# Patient Record
Sex: Male | Born: 1955 | Race: White | Hispanic: No | Marital: Married
Health system: Midwestern US, Academic
[De-identification: ages and names within clinical notes are randomized; demographics above are authoritative.]

---

## 2013-11-25 ENCOUNTER — Ambulatory Visit
Admission: RE | Admit: 2013-11-25 | Discharge: 2013-11-25 | Disposition: A | Discharge status: Home / Self Care | Payer: No Typology Code available for payment source | Source: Ambulatory Visit | Attending: Physician Assistant | Admitting: Physician Assistant

## 2013-11-25 ENCOUNTER — Ambulatory Visit: Payer: Self-pay | Admitting: Emergency Medicine

## 2013-11-25 ENCOUNTER — Telehealth: Payer: Self-pay | Admitting: *Deleted

## 2013-11-25 VITALS — BP 128/78 | HR 72 | Temp 98.2°F | Resp 18 | Ht 73.0 in | Wt 225.0 lb

## 2013-11-25 DIAGNOSIS — T24009A Burn of unspecified degree of unspecified site of unspecified lower limb, except ankle and foot, initial encounter: Secondary | ICD-10-CM

## 2013-11-25 DIAGNOSIS — M7989 Other specified soft tissue disorders: Secondary | ICD-10-CM

## 2013-11-25 DIAGNOSIS — M79609 Pain in unspecified limb: Secondary | ICD-10-CM

## 2013-11-25 DIAGNOSIS — Z23 Encounter for immunization: Secondary | ICD-10-CM

## 2013-11-25 DIAGNOSIS — M79669 Pain in unspecified lower leg: Secondary | ICD-10-CM

## 2013-11-25 MED ORDER — CEPHALEXIN 500 MG PO CAPS: 500.0000 mg | ORAL_CAPSULE | Freq: Three times a day (TID) | ORAL | Status: AC

## 2013-11-25 MED ORDER — SILVER SULFADIAZINE 1 % EX CREA: 1.0000 "application " | TOPICAL_CREAM | Freq: Every day | CUTANEOUS | Status: AC

## 2013-11-25 NOTE — Progress Notes (Signed)
[facility] called US report: No evidence of a DVT He does have a popliteal cyst.  Gave results to Heather Marte- Asked to call pt- Anti-inflammatory medications and elevation.    Unable to leave message- voicemail not set up. 

## 2013-11-25 NOTE — Progress Notes (Signed)
   Subjective:    Patient ID: Nicholas Hunt, male    DOB: 12/24/1955, 58 y.o.   MRN: [id number]  HPI 58 year old male presents for evaluation of a burn on his right calf. Injury occurred 1.5 weeks ago - states he fell asleep with the heating pad on his knee and the cloth slipped off causing a burn. Noticed it when he woke up.  Admits it did form a blister and he "popped it." He has been trying to keep it clean and has used OTC antibiotic cream but is here today because it has not completely resolved yet.  No drainage, warmth, or tenderness.    Patient is also concerned about right lower leg swelling that has been happening intermittently for "a while." He is not sure exactly how long it has been going on but notices it mainly after running on a treadmill. Does admit it is painful sometimes. Denies hx of blood clots or family hx of clotting disorders.  No chest pain, SOB, dizziness, palpitations, nausea, vomiting, fever, or chills.  Has not been on a plane since he returned from [place] 6 months ago. Has recently quit smoking about 2 months ago.     Last tetanus unknown.    Review of Systems  Constitutional: Negative for fever and chills.  Respiratory: Negative for cough, chest tightness and shortness of breath.   Cardiovascular: Positive for leg swelling (right leg). Negative for chest pain and palpitations.  Gastrointestinal: Negative for nausea and vomiting.  Skin: Positive for color change and wound.  Neurological: Negative for dizziness and headaches.       Objective:   Physical Exam  Constitutional: He is oriented to person, place, and time. He appears well-developed and well-nourished.  HENT:  Head: Normocephalic and atraumatic.  Right Ear: External ear normal.  Left Ear: External ear normal.  Eyes: Conjunctivae are normal.  Neck: Normal range of motion.  Cardiovascular: Normal rate.   Pulmonary/Chest: Effort normal.  Musculoskeletal:       Right lower leg: He exhibits swelling  (R>L by 0.5 inch in measurement). He exhibits no tenderness.       Left lower leg: Normal.  Neurological: He is alert and oriented to person, place, and time.  Skin:     Noted area has 2.5 cm scabbed burn with overlying eschar. There is about of surrounding erythema. No purulence, warmth, induration or fluctuance noted.    Psychiatric: He has a normal mood and affect. His behavior is normal. Judgment and thought content normal.          Assessment & Plan:  Pain and swelling of lower leg - Plan: cephALEXin (KEFLEX) 500 MG capsule, silver sulfADIAZINE (SILVADENE) 1 % cream, US Venous Img Lower Unilateral Right, CANCELED: US Venous Img Lower Unilateral Right  Burn of leg - Plan: Td vaccine greater than or equal to 7yo preservative free IM  Need for Tdap vaccination  [facility] at 4:00 p.m If negative, likely musculoskeletal cause of swelling and will treat as such with close f/u. Treat burn with topical silvadene daily and keflex 500 mg tid X 10 days Tetanus updated.  Will determine f/u based on results of US today.

## 2013-11-25 NOTE — Telephone Encounter (Signed)
[facility] called US report: No evidence of a DVT He does have a popliteal cyst.  Gave results to Rhoderick MoodyHeather Marte- Asked to call pt- Anti-inflammatory medications and elevation.    Unable to leave message- voicemail not set up.

## 2013-11-26 NOTE — Telephone Encounter (Signed)
Pt has been advised.  He will RTC if needed.

## 2015-03-21 IMAGING — US US EXTREM LOW VENOUS*R*
1 series · 13 of 24 positions shown · non-contrast
Comparison: None.

CLINICAL DATA: Right leg pain and swelling.



[Series 1: us extrem low venous*right* · 13 of 38 slices shown]
[im 1/38]
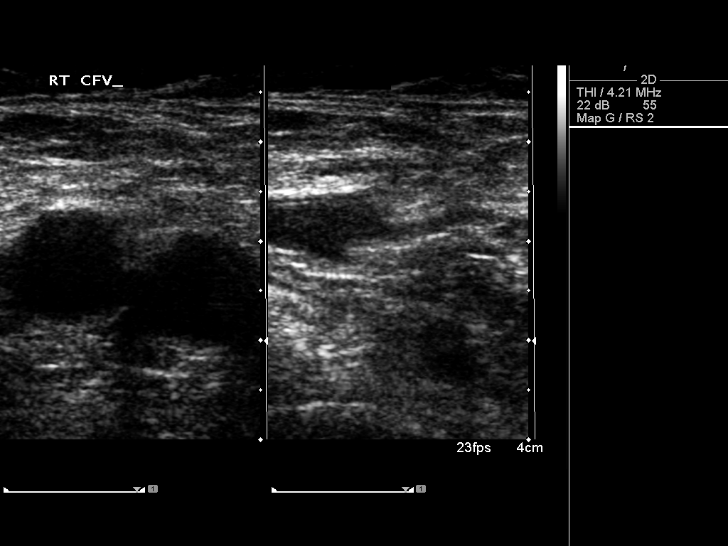
[im 4/38]
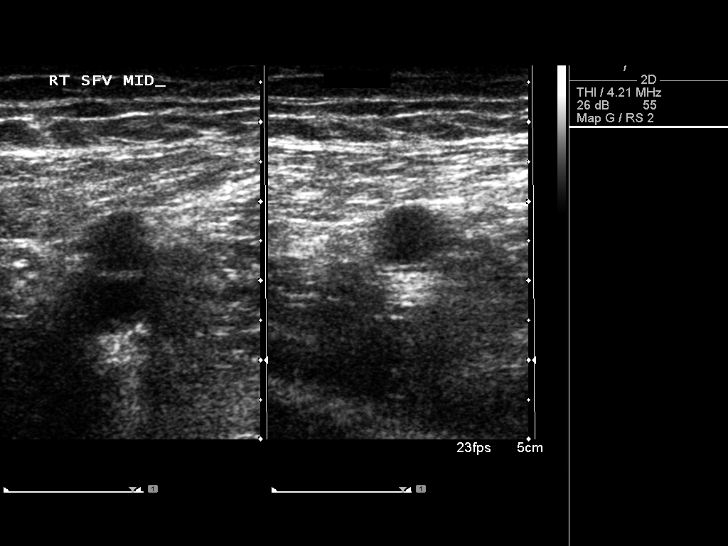
[im 7/38]
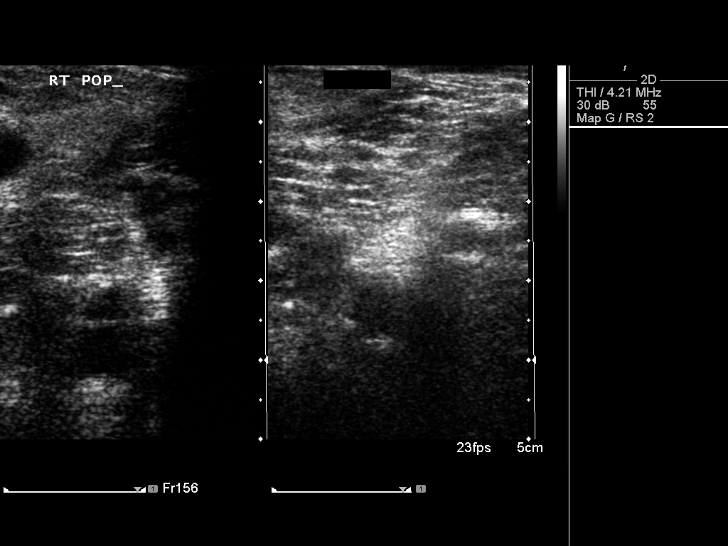
[im 10/38]
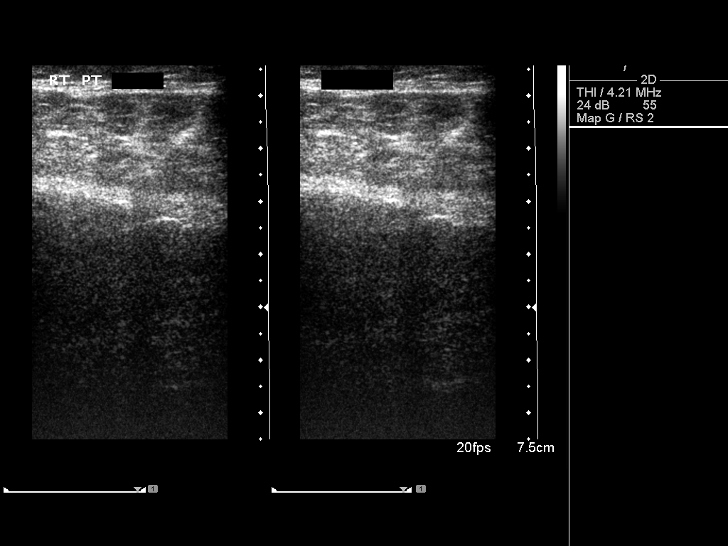
[im 13/38]
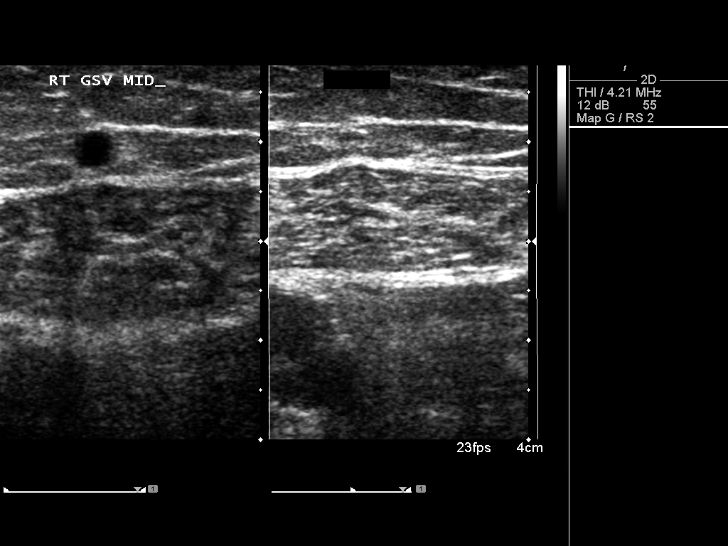
[im 17/38]
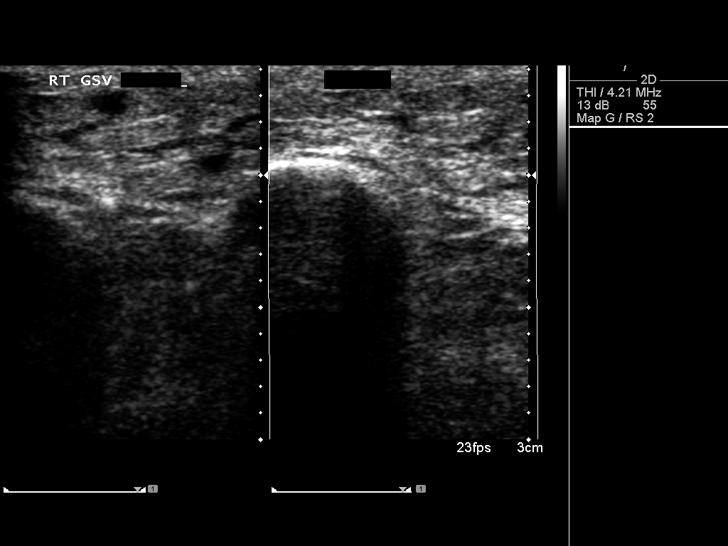
[im 20/38]
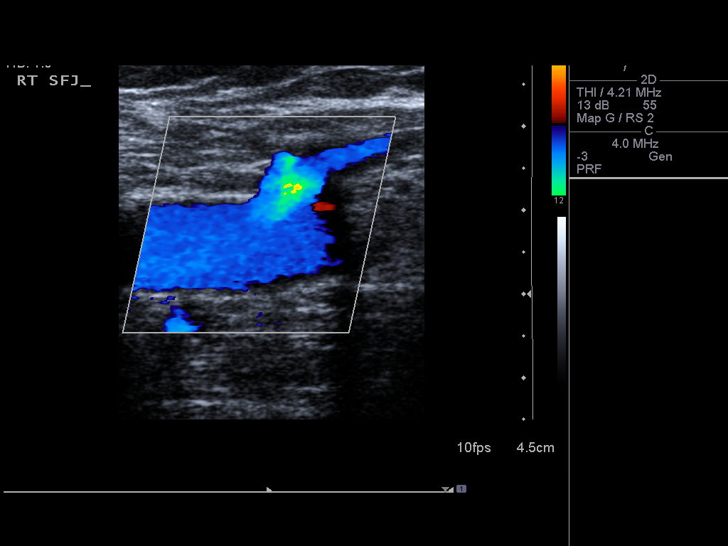
[im 21/38]
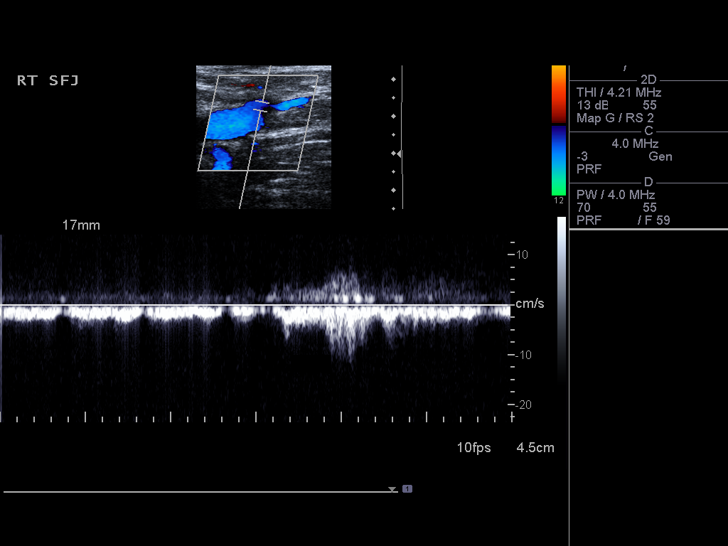
[im 25/38]
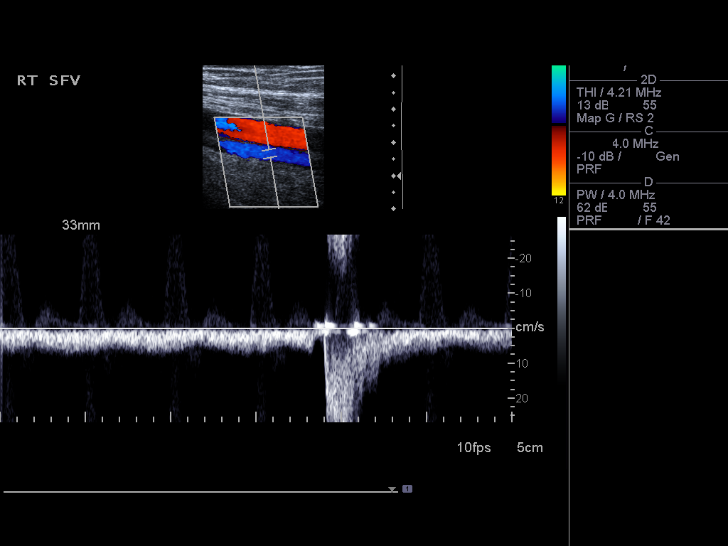
[im 28/38]
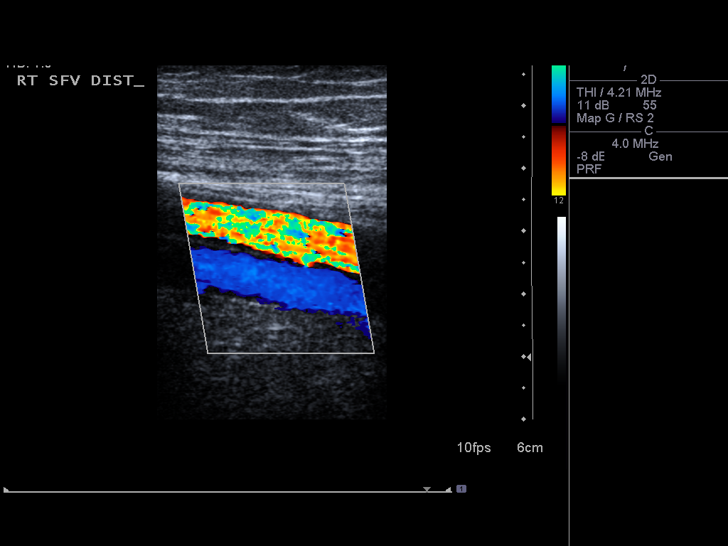
[im 31/38]
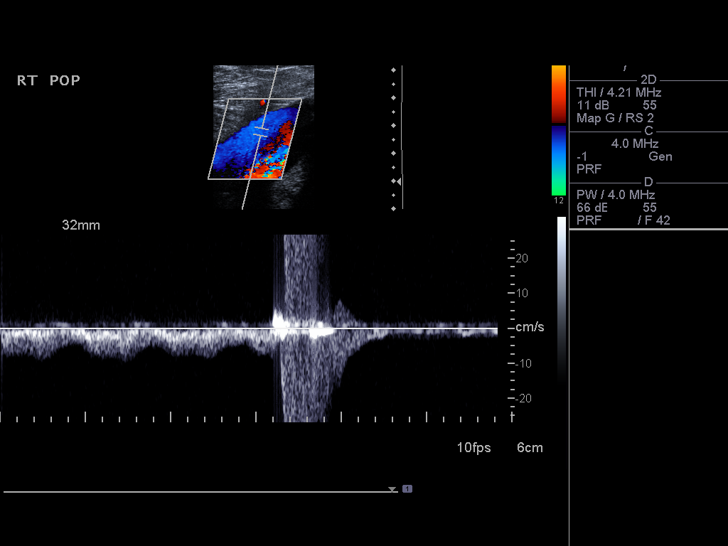
[im 34/38]
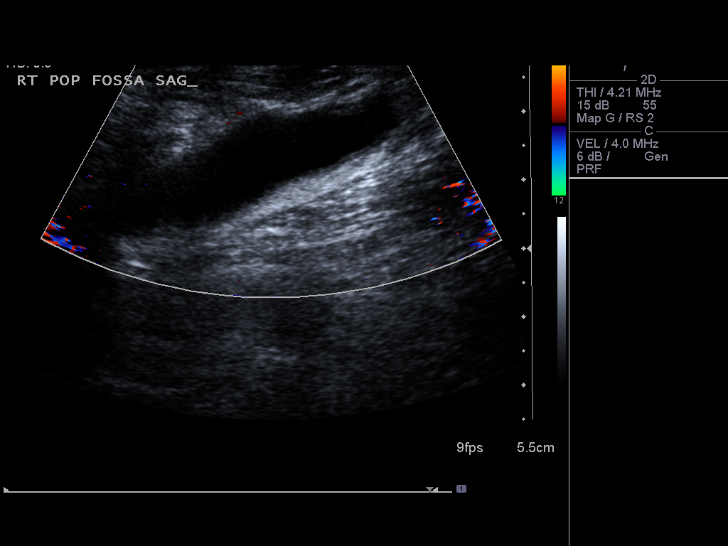
[im 38/38]
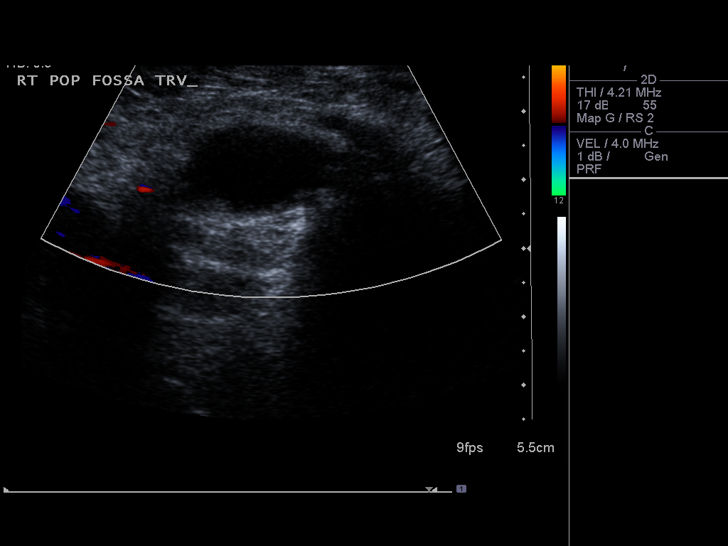

[13 of 24 positions shown; findings below may reference images not displayed]

FINDINGS: Common Femoral Vein: No evidence of thrombus. Normal
compressibility, respiratory phasicity and response to augmentation.

Saphenofemoral Junction: No evidence of thrombus. Normal
compressibility and flow on color Doppler imaging.

Profunda Femoral Vein: No evidence of thrombus. Normal
compressibility and flow on color Doppler imaging.

Femoral Vein: No evidence of thrombus. Normal compressibility,
respiratory phasicity and response to augmentation.

Popliteal Vein: No evidence of thrombus. Normal compressibility,
respiratory phasicity and response to augmentation.

Calf Veins: No evidence of thrombus. Normal compressibility and flow
on color Doppler imaging where visualized.

Superficial Great Saphenous Vein: No evidence of thrombus. Normal
compressibility and flow on color Doppler imaging.

Venous Reflux:  None.

Other Findings: 4.8 x 2.9 x 1.3 cm fluid collection in the popliteal
fossa.
IMPRESSION: 4.8 x 2.9 x 1.8 cm popliteal cyst. No evidence of deep venous
thrombosis.

## 2019-04-30 ENCOUNTER — Ambulatory Visit: Admit: 2019-04-30 | Discharge: 2019-04-30 | Payer: BC Managed Care – PPO

## 2019-04-30 ENCOUNTER — Encounter: Admit: 2019-04-30 | Discharge: 2019-04-30 | Payer: BC Managed Care – PPO

## 2019-04-30 MED ORDER — METHYLPREDNISOLONE 4 MG PO DSPK
ORAL_TABLET | 0 refills | Status: AC
Start: 2019-04-30 — End: ?

## 2019-04-30 MED ORDER — CYCLOBENZAPRINE 5 MG PO TAB
5 mg | ORAL_TABLET | Freq: Every evening | ORAL | 1 refills | 30.00000 days | Status: AC | PRN
Start: 2019-04-30 — End: ?

## 2019-04-30 NOTE — Progress Notes
[facility] HISTORY AND PHYSICAL    Chief Complaint:   Chief Complaint   Patient presents with   ? Back Pain       HISTORY OF PRESENT ILLNESS:   Wayne Berry is a 63 y.o. male who  has a past medical history of No pertinent past medical history. who presents for evaluation. He is presenting with left low back pain radiating down left leg to left calf.  He reports having a bad fall where he twisted trying to hold a friend up.  Pain is described as constant in nature, sharp, shooting, electric. Pain is made worse when standing- left leg goes numb. Pain is made better with rest, lying down.  He has not had any formal physical therapy, but has been doing home exercises. He has only taken tylenol for medication. He has not had any intervention or surgery.             Kevron Naccarato denies any recent fevers, chills, infection, antibiotics, bowel or bladder incontinence, saddle anesthesia, bleeding issues, or recent anticoagulant.     ROS:   Review of Systems   Musculoskeletal: Positive for back pain.   All other systems reviewed and are negative.      Past Medical History:  Medical History:   Diagnosis Date   ? No pertinent past medical history        Family History:  No family history on file.    Social History:  Lives in [place] [address]    Social History     Socioeconomic History   ? Marital status: Married     Spouse name: Not on file   ? Number of children: Not on file   ? Years of education: Not on file   ? Highest education level: Not on file   Occupational History   ? Not on file   Tobacco Use   ? Smoking status: Current Some Day Smoker   ? Smokeless tobacco: Never Used   Substance and Sexual Activity   ? Alcohol use: Yes   ? Drug use: Never   ? Sexual activity: Not on file   Other Topics Concern   ? Not on file   Social History Narrative   ? Not on file       Allergies:  No Known Allergies    Medications:  No current outpatient medications on file.    Physical examination: There were no vitals taken for this visit.       Gen: Alert & Oriented X 3  HEENT: EOMI  Neck: Supple, no elevated JVP  Heart: Extremities well perfused  Lungs: non labored breathing  Abdomen: Soft, non-tender, non-distended  Skin: no gross lesions appreciated  Ext: purposeful movement of extremities       LOWER EXTREMITIES  MS:   Root Right Left   Hip Flexion L2 5 5   Knee Flexion L5/S1 5 5   Knee Extension L3 5 5   Dorsiflexion L4 5 5   Plantarflexion S1 5 5   EHL Extension L5 5 4     Gait was smooth and symmetric with equal arm swing.        No pain reproduced with facet loading.    No tenderness to palpation along the spinous process, facet joints, paraspinal musculature, SI joints, gluteal musculature, greater trochanters.      Patient is able to forward flex to knees, and able to extend without significant pain.    Full ROM bilateral  lower extremities.      Positive left sided slump testing.  Positive left sided straight leg testing to 30-75 degrees.      Negative FADIR testing.  Negative Patrick-FABER testing.  Negative Fortin Finger Sign.   Negative Gaenslens testing.    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       Last Cr and LFT's:  No results found for: CR, AST, ALT, ALKPHOS, TOTBILI         Assessment:  The pain complaints are most likely due to:  1. Lumbar disc herniation with radiculopathy    2. Lumbar discogenic pain syndrome      Wayne Berry is a 63 y.o. male who  has a past medical history of No pertinent past medical history. who presents for evaluation of pain.    Plan:  1.  Lifestyle modification.  Continue current activities as tolerated.    2.  Medication. - Medrol dose pack prescribed for pain control.  -Flexeril 5 mg qhs to help with sleep and muscle spasms.   3.  Therapy.    - Patient provided prescription for formalized physical therapy to work on lumbar and core stabilization exercises, soft tissue mobilization, stretching techniques, as well as modalities such as TENS unit, traction, therapeutic ultrasound, as well as Graston Technique, and McKenzie technique.  4.  Interventions.  Scheduling L4-5, L5S1 TFESI   5.  Diagnostics.  L spine xray to assess for spondylolithesis.   6.  Follow-up.  Patient will follow-up as needed.      Risks/benefits of all pharmacologic and interventional treatments discussed and questions answered.     Thank you for this kind referral for consultation. Please feel free to contact me with any questions or concerns.

## 2019-05-06 ENCOUNTER — Encounter: Admit: 2019-05-06 | Discharge: 2019-05-06 | Payer: BC Managed Care – PPO

## 2019-06-04 ENCOUNTER — Encounter: Admit: 2019-06-04 | Discharge: 2019-06-04 | Payer: BC Managed Care – PPO

## 2019-06-04 DIAGNOSIS — M5116 Intervertebral disc disorders with radiculopathy, lumbar region: Secondary | ICD-10-CM

## 2023-03-07 ENCOUNTER — Encounter: Admit: 2023-03-07 | Discharge: 2023-03-07 | Payer: BC Managed Care – PPO
